# Patient Record
Sex: Female | Born: 1965 | Race: White | Hispanic: No | Marital: Single | Smoking: Never smoker
Health system: Southern US, Community
[De-identification: ages and names within clinical notes are randomized; demographics above are authoritative.]

## PROBLEM LIST (undated history)

## (undated) HISTORY — PX: OTHER SURGICAL HISTORY: SHX169

---

## 2015-06-07 ENCOUNTER — Encounter: Payer: Self-pay | Admitting: Emergency Medicine

## 2015-06-07 ENCOUNTER — Emergency Department
Admission: EM | Admit: 2015-06-07 | Discharge: 2015-06-07 | Disposition: A | Discharge status: Home / Self Care | Payer: Worker's Compensation | Attending: Student | Admitting: Student

## 2015-06-07 ENCOUNTER — Emergency Department: Payer: Self-pay

## 2015-06-07 DIAGNOSIS — Y9389 Activity, other specified: Secondary | ICD-10-CM | POA: Insufficient documentation

## 2015-06-07 DIAGNOSIS — S60032A Contusion of left middle finger without damage to nail, initial encounter: Secondary | ICD-10-CM | POA: Insufficient documentation

## 2015-06-07 DIAGNOSIS — Y990 Civilian activity done for income or pay: Secondary | ICD-10-CM | POA: Insufficient documentation

## 2015-06-07 DIAGNOSIS — S6010XA Contusion of unspecified finger with damage to nail, initial encounter: Secondary | ICD-10-CM

## 2015-06-07 DIAGNOSIS — Y9289 Other specified places as the place of occurrence of the external cause: Secondary | ICD-10-CM | POA: Insufficient documentation

## 2015-06-07 DIAGNOSIS — S6000XA Contusion of unspecified finger without damage to nail, initial encounter: Secondary | ICD-10-CM

## 2015-06-07 DIAGNOSIS — S60042A Contusion of left ring finger without damage to nail, initial encounter: Secondary | ICD-10-CM | POA: Insufficient documentation

## 2015-06-07 DIAGNOSIS — S60052A Contusion of left little finger without damage to nail, initial encounter: Secondary | ICD-10-CM | POA: Insufficient documentation

## 2015-06-07 DIAGNOSIS — W231XXA Caught, crushed, jammed, or pinched between stationary objects, initial encounter: Secondary | ICD-10-CM | POA: Insufficient documentation

## 2015-06-07 MED ORDER — NAPROXEN 500 MG PO TABS: 500.0000 mg | ORAL_TABLET | Freq: Two times a day (BID) | ORAL | Status: AC

## 2015-06-07 NOTE — ED Notes (Signed)
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 Pain and swelling

## 2015-06-07 NOTE — Discharge Instructions (Signed)
Contusion °A contusion is a deep bruise. Contusions are the result of a blunt injury to tissues and muscle fibers under the skin. The injury causes bleeding under the skin. The skin overlying the contusion may turn blue, purple, or yellow. Minor injuries will give you a painless contusion, but more severe contusions may stay painful and swollen for a few weeks.  °CAUSES  °This condition is usually caused by a blow, trauma, or direct force to an area of the body. °SYMPTOMS  °Symptoms of this condition include: °· Swelling of the injured area. °· Pain and tenderness in the injured area. °· Discoloration. The area may have redness and then turn blue, purple, or yellow. °DIAGNOSIS  °This condition is diagnosed based on a physical exam and medical history. An X-ray, CT scan, or MRI may be needed to determine if there are any associated injuries, such as broken bones (fractures). °TREATMENT  °Specific treatment for this condition depends on what area of the body was injured. In general, the best treatment for a contusion is resting, icing, applying pressure to (compression), and elevating the injured area. This is often called the RICE strategy. Over-the-counter anti-inflammatory medicines may also be recommended for pain control.  °HOME CARE INSTRUCTIONS  °· Rest the injured area. °· If directed, apply ice to the injured area: °¨ Put ice in a plastic bag. °¨ Place a towel between your skin and the bag. °¨ Leave the ice on for 20 minutes, 2-3 times per day. °· If directed, apply light compression to the injured area using an elastic bandage. Make sure the bandage is not wrapped too tightly. Remove and reapply the bandage as directed by your health care provider. °· If possible, raise (elevate) the injured area above the level of your heart while you are sitting or lying down. °· Take over-the-counter and prescription medicines only as told by your health care provider. °SEEK MEDICAL CARE IF: °· Your symptoms do not  improve after several days of treatment. °· Your symptoms get worse. °· You have difficulty moving the injured area. °SEEK IMMEDIATE MEDICAL CARE IF:  °· You have severe pain. °· You have numbness in a hand or foot. °· Your hand or foot turns pale or cold. °  °This information is not intended to replace advice given to you by your health care provider. Make sure you discuss any questions you have with your health care provider. °  °Document Released: 02/24/2005 Document Revised: 02/05/2015 Document Reviewed: 10/02/2014 °Elsevier Interactive Patient Education ©2016 [employer] °Subungual Hematoma °A subungual hematoma is a pocket of blood that collects under the fingernail or toenail. The pressure created by the blood under the nail can cause pain. °CAUSES  °A subungual hematoma occurs when an injury to the finger or toe causes a blood vessel beneath the nail to break. The injury can occur from a direct blow such as slamming a finger in a door. It can also occur from a repeated injury such as pressure on the foot in a shoe while running. A subungual hematoma is sometimes called runner's toe or tennis toe. °SYMPTOMS  °· Blue or dark blue skin under the nail. °· Pain or throbbing in the injured area. °DIAGNOSIS  °Your caregiver can determine whether you have a subungual hematoma based on your history and a physical exam. If your caregiver thinks you might have a broken (fractured) bone, X-rays may be taken. °TREATMENT  °Hematomas usually go away on their own over time. Your caregiver may make a   a hole in the nail to drain the blood. Draining the blood is painless and usually provides significant relief from pain and throbbing. The nail usually grows back normally after this procedure. In some cases, the nail may need to be removed. This is done if there is a cut under the nail that requires stitches (sutures). HOME CARE INSTRUCTIONS   Put ice on the injured area.  Put ice in a plastic bag.  Place a towel  between your skin and the bag.  Leave the ice on for 15-20 minutes, 03-04 times a day for the first 1 to 2 days.  Elevate the injured area to help decrease pain and swelling.  If you were given a bandage, wear it for as long as directed by your caregiver.  If part of your nail falls off, trim the remaining nail gently. This prevents the nail from catching on something and causing further injury.  Only take over-the-counter or prescription medicines for pain, discomfort, or fever as directed by your caregiver. SEEK IMMEDIATE MEDICAL CARE IF:   You have redness or swelling around the nail.  You have yellowish-white fluid (pus) coming from the nail.  Your pain is not controlled with medicine.  You have a fever. MAKE SURE YOU:  Understand these instructions.  Will watch your condition.  Will get help right away if you are not doing well or get worse.   This information is not intended to replace advice given to you by your health care provider. Make sure you discuss any questions you have with your health care provider.   Document Released: 05/14/2000 Document Revised: 08/09/2011 Document Reviewed: 10/02/2014 Elsevier Interactive Patient Education [employer]

## 2015-06-07 NOTE — ED Provider Notes (Signed)
[facility] Emergency Department Provider Note ?  ? ____________________________________________ ? Time seen: 2:55 PM ? I have reviewed the triage vital signs and the nursing notes.  ________ HISTORY ? Chief Complaint Hand Pain     HPI  Mary Duarte is a 50 y.o. female   who presents emergency department complaining of left third, fourth, and fifth finger pain. 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 She states that she felt a door closing was able to pull her hand freely. She states that all pain is in the distal ends of the fingertips. She does endorse mild bleeding around the nail of the third digit. No other injury or complaint. Patient has not take any medicines prior to arrival. Patient states the fingers aren't aching sensation. ? ? ? History reviewed. No pertinent past medical history.  There are no active problems to display for this patient.  ? Past Surgical History  Procedure Laterality Date  . Ovaries removed to left side      ? Current Outpatient Rx  Name  Route  Sig  Dispense  Refill  . naproxen (NAPROSYN) 500 MG tablet   Oral   Take 1 tablet (500 mg total) by mouth 2 (two) times daily with a meal.   60 tablet   0    ? Allergies Review of patient's allergies indicates no known allergies. ? No family history on file. ? Social History Social History  Substance Use Topics  . Smoking status: Never Smoker   . Smokeless tobacco: Never Used  . Alcohol Use: Yes     Comment: occas   ? Review of Systems Constitutional: no fever. Eyes: no discharge ENT: no sore throat. Cardiovascular: no chest pain. Respiratory: no cough. No sob Gastrointestinal: denies abdominal pain, vomiting, diarrhea, and constipation Genitourinary: no dysuria. Negative for hematuria Musculoskeletal: Negative for back pain. Endorses left third, fourth, fifth digit and pain Skin: Negative  for rash. Neurological: Negative for headaches  10-point ROS otherwise negative.  _______________ PHYSICAL EXAM: ? VITAL SIGNS:   ED Triage Vitals  Enc Vitals Group     BP 06/07/15 1355 136/81 mmHg     Pulse Rate 06/07/15 1355 78     Resp 06/07/15 1355 16     Temp 06/07/15 1355 97.5 F (36.4 C)     Temp Source 06/07/15 1355 Oral     SpO2 06/07/15 1355 97 %     Weight 06/07/15 1355 160 lb (72.576 kg)     Height 06/07/15 1355 5\' 7"  (1.702 m)     Head Cir --      Peak Flow --      Pain Score 06/07/15 1357 5     Pain Loc --      Pain Edu? --      Excl. in GC? --    ?  Constitutional: Alert and oriented. Well appearing and in no distress. Eyes: Conjunctivae are normal.  ENT      Head: Normocephalic and atraumatic.      Ears:       Nose: No congestion/rhinnorhea.      Mouth/Throat: Mucous membranes are moist.   Hematological/Lymphatic/Immunilogical: No cervical lymphadenopathy. Cardiovascular: Normal rate, regular rhythm. Normal S1 and S2. Respiratory: Normal respiratory effort without tachypnea nor retractions. Lungs CTAB. Gastrointestinal: Soft and nontender. No distention. There is no CVA tenderness. Genitourinary:  Musculoskeletal: Nontender with normal range of motion in all extremities.  Minor edema noted to the distal aspect of the anterior surface of third, fourth, fifth digits. No obvious deformity. Capillary refill is intact in these digits. Sensation is intact and equal to unaffected hand. Full range of motion to all digits. Minor bleeding around the nail edge third digit. No lacerations, abrasions noted.  Neurologic:  Normal speech and language. No gross focal neurologic deficits are appreciated. Skin:  Skin is warm, dry and intact. No rash noted. Psychiatric: Mood and affect are normal. Speech and behavior are normal. Patient exhibits appropriate insight and judgment.    LABS (all labs ordered are listed, but only abnormal results are displayed)  Labs Reviewed  - No data to display  ___________ RADIOLOGY    _____________ PROCEDURES ? Procedure(s) performed:    Medications - No data to display  ______________________________________________________ INITIAL IMPRESSION / ASSESSMENT AND PLAN / ED COURSE ? Pertinent labs & imaging results that were available during my care of the patient were reviewed by me and considered in my medical decision making (see chart for details).   This is diagnoses is consistent with contusions to the third, fourth, fifth digits left hand. Patient also has a subungual hematoma that is draining to the third digit. This area will not be drained at this time due to the drainage already. Patient placed on anti-inflammatories for symptom control. She will follow-up with primary care provider or orthopedics in her hometown should symptoms persist past this treatment course.     New Prescriptions   NAPROXEN (NAPROSYN) 500 MG TABLET    Take 1 tablet (500 mg total) by mouth 2 (two) times daily with a meal.   ____________________________________________ FINAL CLINICAL IMPRESSION(S) / ED DIAGNOSES?  Final diagnoses:  Finger contusion, initial encounter  Subungual hematoma of digit of hand, initial encounter            Racheal PatchesJonathan D Gimena Buick, PA-C 06/07/15 1458  Gayla DossEryka A Gayle, MD 06/07/15 [phone]

## 2015-06-07 NOTE — ED Notes (Signed)
Wash basin filled with cool water and patient now has her hand placed in it for comfort.

## 2017-01-16 IMAGING — CR DG HAND COMPLETE 3+V*L*
1 series · 3 of 3 positions shown · non-contrast
Comparison: None.

CLINICAL DATA: Left hand injury, pain and swelling.

EXAM:
LEFT HAND - COMPLETE 3+ VIEW

[Series 1: pa · 0.17mm/px · 3 of 3 slices shown]
[im 1/3]
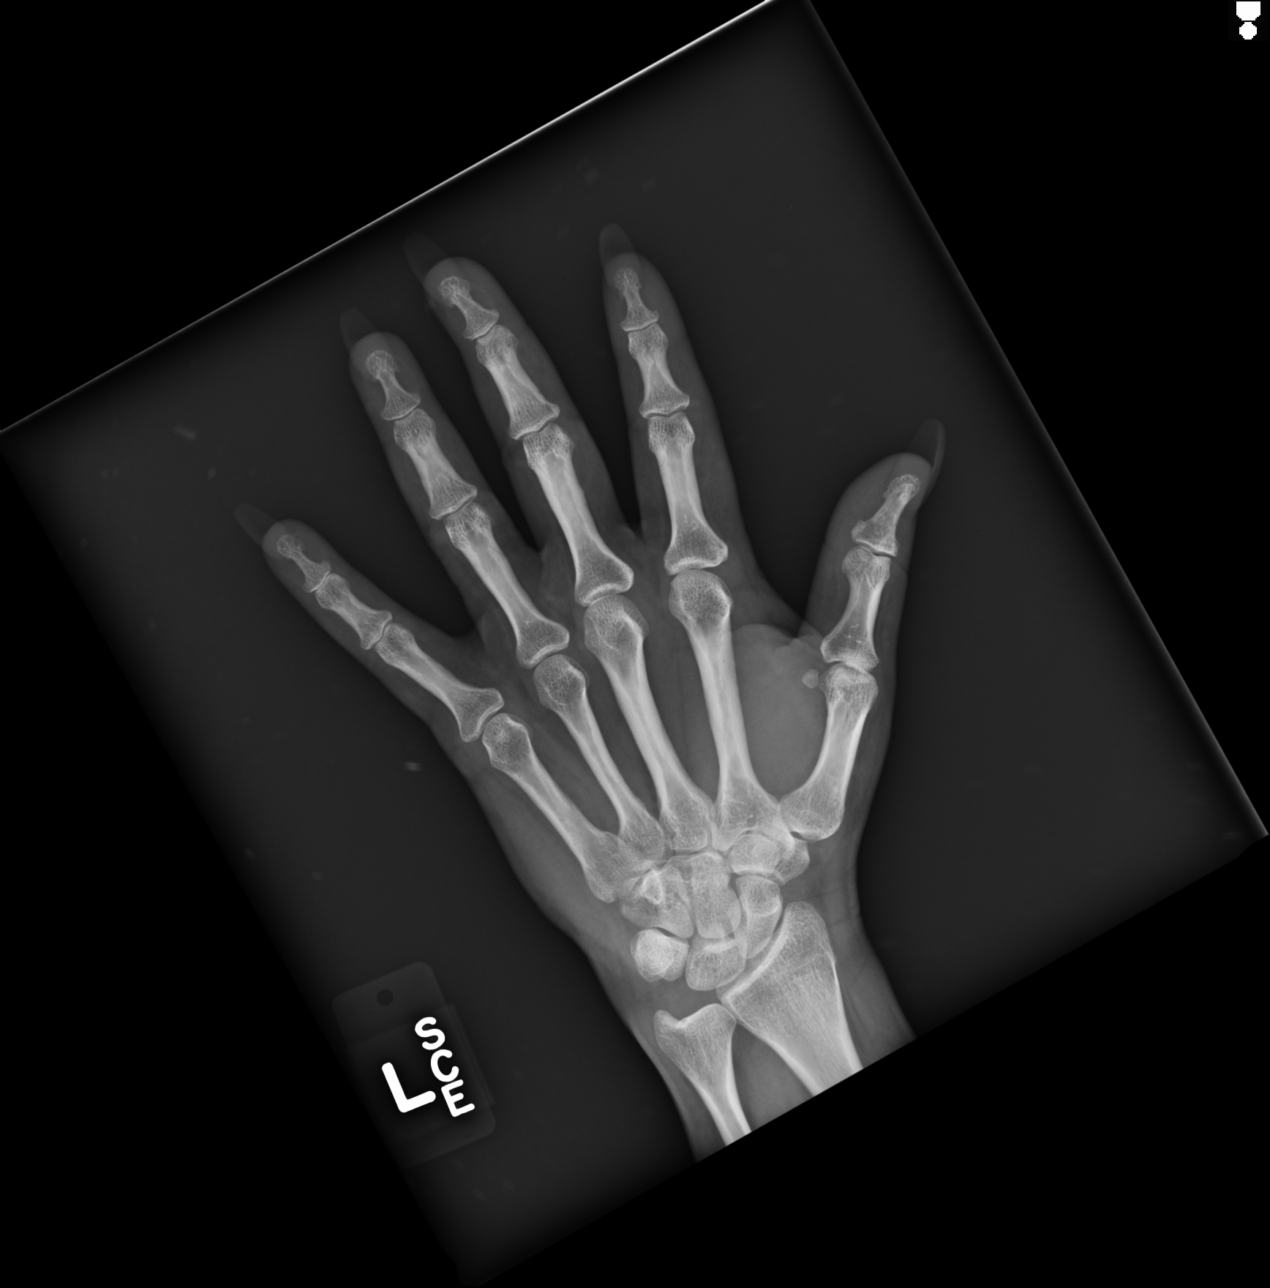
[im 2/3]
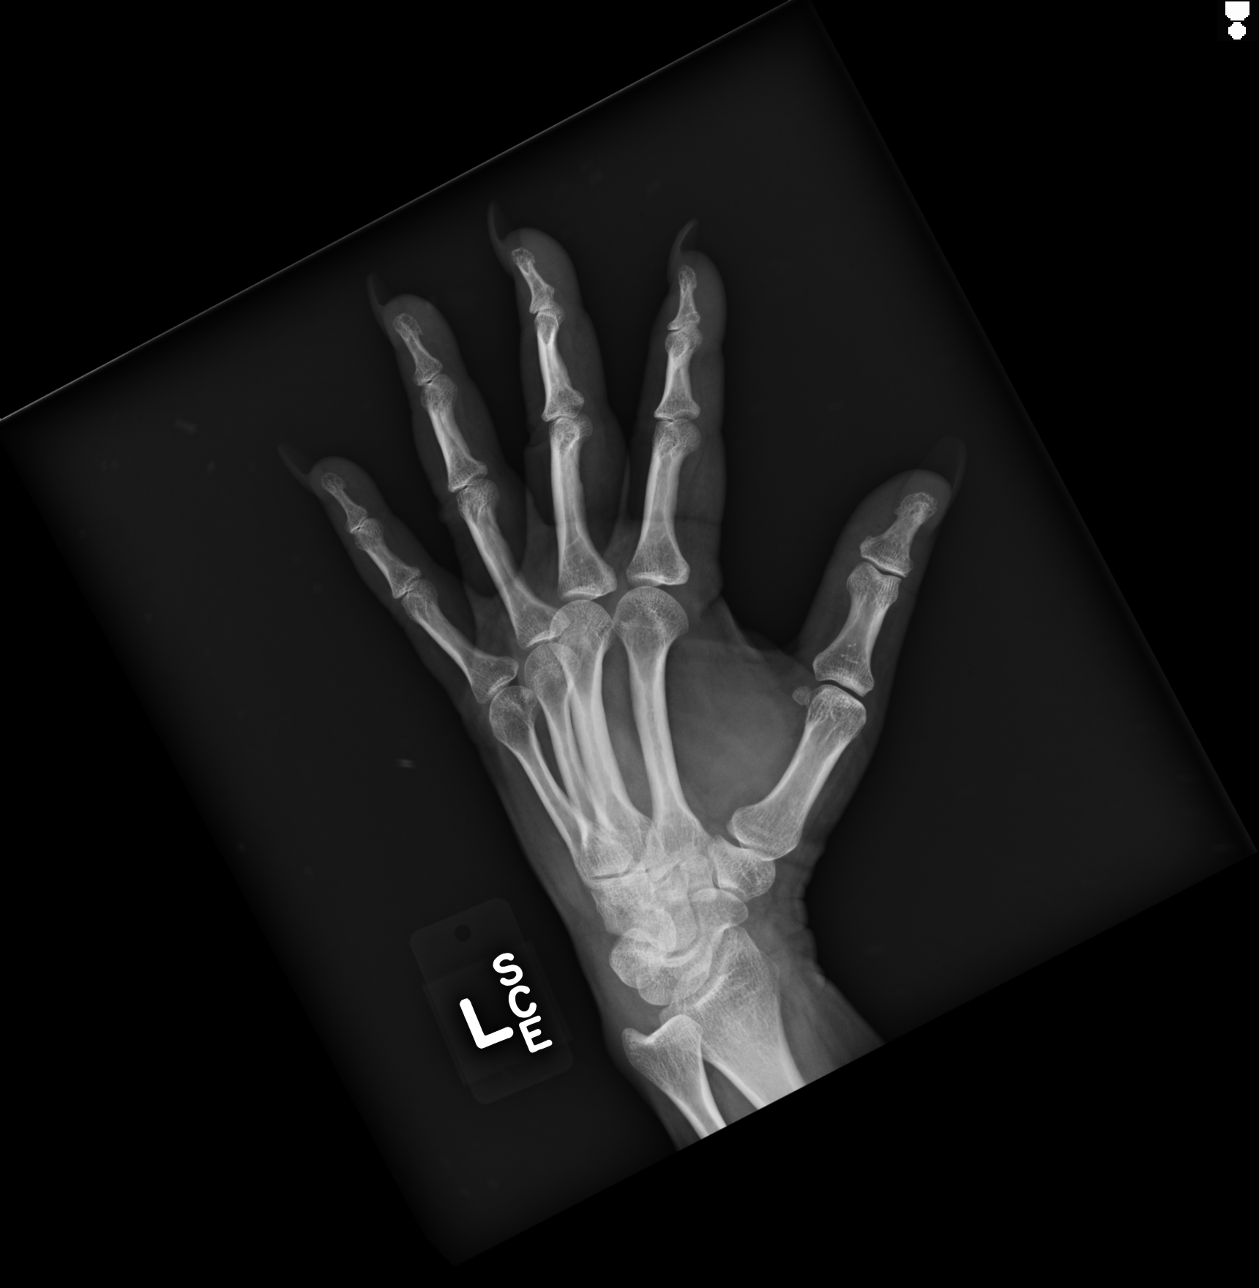
[im 3/3]
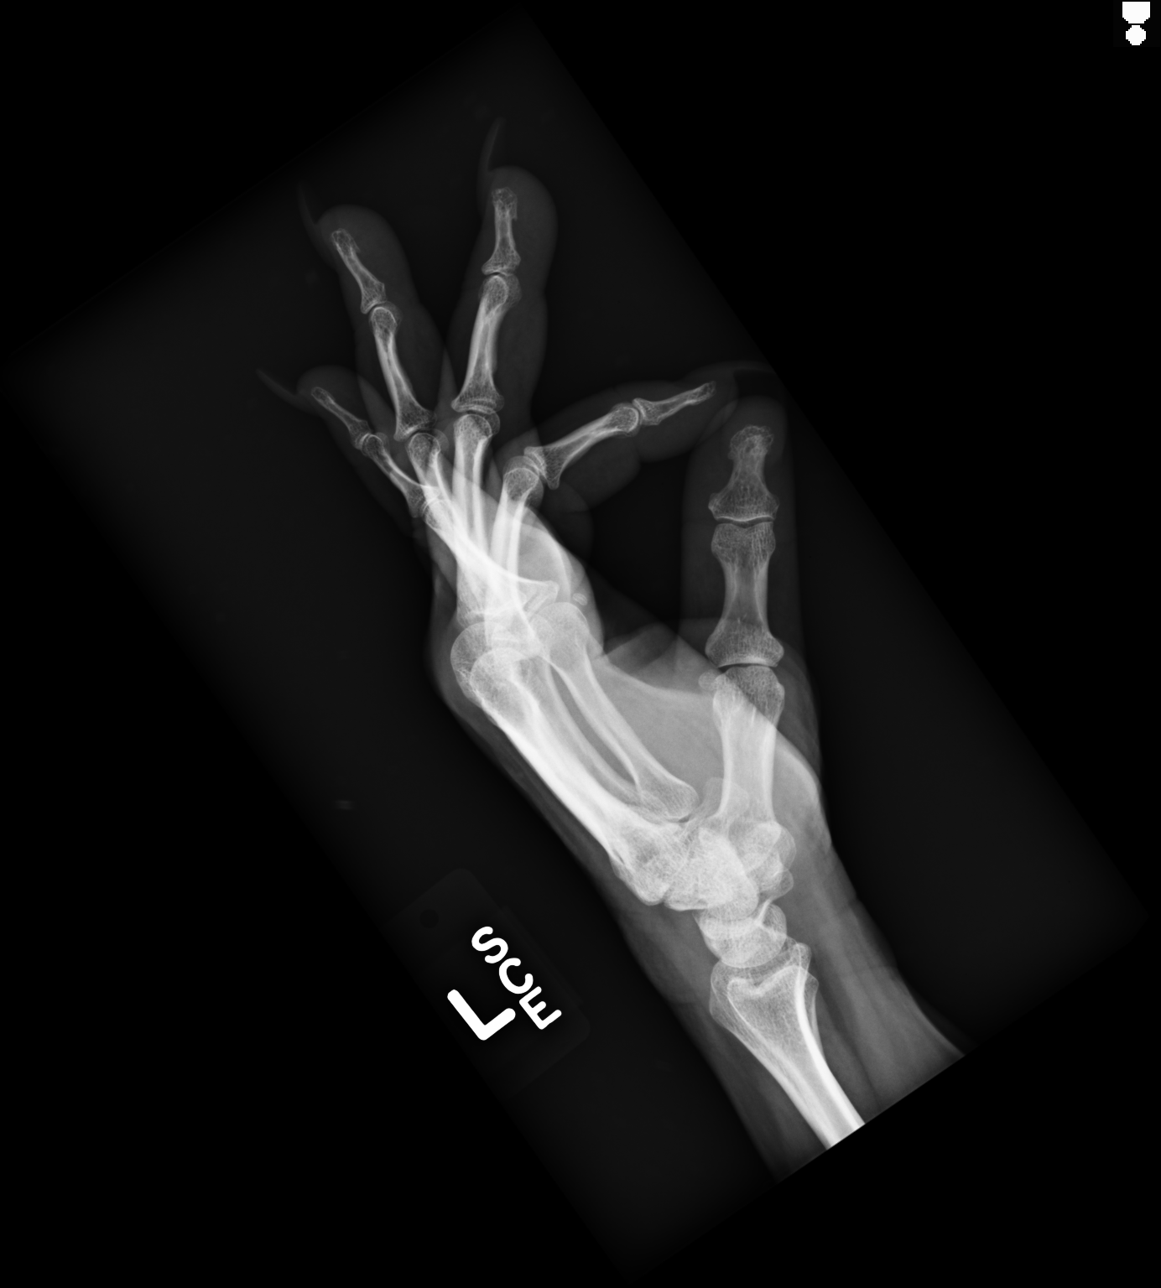

[3 of 3 positions shown; findings below may reference images not displayed]

FINDINGS: There is no evidence of fracture or dislocation. There is no
evidence of arthropathy or other focal bone abnormality. Soft
tissues are unremarkable.
IMPRESSION: Negative.
